# Patient Record
Sex: Female | Born: 1985 | Race: White | Hispanic: No | Marital: Single | Smoking: Never smoker
Health system: Southern US, Community
[De-identification: ages and names within clinical notes are randomized; demographics above are authoritative.]

## PROBLEM LIST (undated history)

## (undated) DIAGNOSIS — R251 Tremor, unspecified: Secondary | ICD-10-CM

## (undated) DIAGNOSIS — G250 Essential tremor: Principal | ICD-10-CM

## (undated) DIAGNOSIS — G252 Other specified forms of tremor: Principal | ICD-10-CM

## (undated) DIAGNOSIS — K219 Gastro-esophageal reflux disease without esophagitis: Secondary | ICD-10-CM

## (undated) DIAGNOSIS — Z20828 Contact with and (suspected) exposure to other viral communicable diseases: Secondary | ICD-10-CM

## (undated) DIAGNOSIS — N6452 Nipple discharge: Secondary | ICD-10-CM

## (undated) HISTORY — DX: Essential tremor: G25.0

## (undated) HISTORY — DX: Gastro-esophageal reflux disease without esophagitis: K21.9

## (undated) HISTORY — DX: Contact with and (suspected) exposure to other viral communicable diseases: Z20.828

## (undated) HISTORY — DX: Other specified forms of tremor: G25.2

## (undated) HISTORY — DX: Tremor, unspecified: R25.1

---
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
## 2008-11-28 ENCOUNTER — Emergency Department: Payer: Self-pay | Admitting: Emergency Medicine

## 2013-04-07 ENCOUNTER — Encounter: Payer: Self-pay | Admitting: Neurology

## 2013-04-08 ENCOUNTER — Ambulatory Visit: Payer: Self-pay | Admitting: Gastroenterology

## 2013-04-08 ENCOUNTER — Encounter (INDEPENDENT_AMBULATORY_CARE_PROVIDER_SITE_OTHER): Payer: Self-pay

## 2013-04-08 ENCOUNTER — Ambulatory Visit (INDEPENDENT_AMBULATORY_CARE_PROVIDER_SITE_OTHER): Payer: BC Managed Care – PPO | Admitting: Neurology

## 2013-04-08 ENCOUNTER — Encounter: Payer: Self-pay | Admitting: Neurology

## 2013-04-08 VITALS — BP 111/71 | HR 68 | Ht 63.0 in | Wt 122.0 lb

## 2013-04-08 DIAGNOSIS — R209 Unspecified disturbances of skin sensation: Secondary | ICD-10-CM

## 2013-04-08 DIAGNOSIS — G250 Essential tremor: Secondary | ICD-10-CM

## 2013-04-08 HISTORY — DX: Essential tremor: G25.0

## 2013-04-08 NOTE — Progress Notes (Signed)
Reason for visit: Tremor  Veronica Pena is a 27 y.o. female  History of present illness:  Veronica Pena is a 27 year old right-handed white female with a lifelong history of a tremor affecting both upper extremities, slightly worse on the left. The patient indicates that the tremor is more noticeable at times than others, and she believes that over the last several years the tremor has minimally worsened. The patient denies any other family history of tremor with the exception that her maternal grandfather had Parkinson's disease. The patient denies that the tremor is affecting her ability to perform tasks such as handwriting or feeding herself. The patient has recently undergone blood work that includes a thyroid profile that was unremarkable. The patient over the last month has noted transient episodes of numbness affecting the ulnar aspect of the left hand, and the lateral aspect of the right foot. The foot and hand may be affected independently of one another. The episodes last anywhere from 15 minutes to 1 hour, and may occur randomly once or twice a week. The between episodes, the patient feels completely normal. The patient denies any other associated symptoms such as weakness, clumsiness, discomfort, neck pain, back pain, or problems controlling the bowels or the bladder. The patient denies any problems with balance. The patient has been recently worked up for some abdominal discomfort, and she has been placed on Prilosec and a probiotic with some benefit. The patient does have headaches, but she does not correlate the numbness episodes with the headache. The patient gets 1 or 2 headaches a month. The patient is sent to this office for an evaluation.  Past Medical History  Diagnosis Date  . 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 Tremor     bilateral hands  . 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 No pertinent past surgical history.  History reviewed. No pertinent family  history.  Social history:  reports that she has never smoked. She has never used smokeless tobacco. She reports that she drinks alcohol. She reports that she does not use illicit drugs.  Medications:  Current Outpatient Prescriptions on File Prior to Visit  Medication Sig Dispense Refill  . omeprazole (PRILOSEC OTC) 20 MG tablet Take 20 mg by mouth daily.       No current facility-administered medications on file prior to visit.      Allergies  Allergen Reactions  . Vibramycin [Doxycycline Calcium] Hives and Shortness Of Breath    ROS:  Out of a complete 14 system review of symptoms, the patient complains only of the following symptoms, and all other reviewed systems are negative.  Palpitations of the heart Constipation Feeling hot, increased thirst, flushing Numbness, tremor  Blood pressure 111/71, pulse 68, height 5\' 3"  (1.6 m), weight 122 lb (55.339 kg).  Physical Exam  General: The patient is alert and cooperative at the time of the examination.  Head: Pupils are equal, round, and reactive to light. Discs are flat bilaterally.  Neck: The neck is supple, no carotid bruits are noted.  Respiratory: The respiratory examination is clear.  Cardiovascular: The cardiovascular examination reveals a regular rate and rhythm, no obvious murmurs or rubs are noted.  Skin: Extremities are without significant edema.  Neurologic Exam  Mental status:  Cranial nerves: Facial symmetry is present. There is good sensation of the face to pinprick and soft touch bilaterally. The strength of the facial muscles and the muscles to head turning and shoulder shrug are normal bilaterally. Speech  is well enunciated, no aphasia or dysarthria is noted. Extraocular movements are full. Visual fields are full.  Motor: The motor testing reveals 5 over 5 strength of all 4 extremities. Good symmetric motor tone is noted throughout.  Sensory: Sensory testing is intact to pinprick, soft touch,  vibration sensation, and position sense on all 4 extremities. No evidence of extinction is noted.  Coordination: Cerebellar testing reveals good finger-nose-finger and heel-to-shin bilaterally. A mild intention tremor seen with the hands bilaterally. The tremor mainly affects the index fingers and thumb laterally. Tinel sign on the wrists were negative bilaterally.  Gait and station: Gait is normal. Tandem gait is normal. Romberg is negative. No drift is seen.  Reflexes: Deep tendon reflexes are symmetric and normal bilaterally. Toes are downgoing bilaterally.   Assessment/Plan:  One. History of tremor, heightened physiologic tremor  2. Episodic, transient numbness  The patient has episodes of numbness lasting only a few minutes, with full resolution. The etiology of this is likely to be benign, and I do not believe that further neurologic workup at this time is indicated given a normal neurologic examination. If the symptoms become more persistent, or new symptoms are noted, the patient will contact our office at that time, and a reevaluation will be done. The tremor itself is likely a heightened physiologic tremor. Thyroid studies have been unremarkable. B12 levels have been unremarkable. If the patient finds that the tremor is affecting her ability to perform tasks with her hands, use of beta blocker such as propranolol may be helpful. At this point, the patient will followup through this office if needed.  Marlan Palau MD 04/08/2013 2:23 PM  [facility] [address] [place], [place] [address]  Phone [phone] Fax [phone]

## 2015-06-01 DIAGNOSIS — N6452 Nipple discharge: Secondary | ICD-10-CM

## 2015-06-01 HISTORY — DX: Nipple discharge: N64.52

## 2015-07-21 ENCOUNTER — Other Ambulatory Visit: Payer: Self-pay | Admitting: Obstetrics and Gynecology

## 2015-07-21 DIAGNOSIS — N6452 Nipple discharge: Secondary | ICD-10-CM

## 2015-08-01 ENCOUNTER — Ambulatory Visit
Admission: RE | Admit: 2015-08-01 | Discharge: 2015-08-01 | Disposition: A | Discharge status: Home / Self Care | Payer: BLUE CROSS/BLUE SHIELD | Source: Ambulatory Visit | Attending: Obstetrics and Gynecology | Admitting: Obstetrics and Gynecology

## 2015-08-01 DIAGNOSIS — N6452 Nipple discharge: Secondary | ICD-10-CM | POA: Diagnosis not present

## 2015-08-01 HISTORY — DX: Nipple discharge: N64.52

## 2016-08-19 ENCOUNTER — Other Ambulatory Visit: Payer: Self-pay

## 2016-08-19 ENCOUNTER — Ambulatory Visit (INDEPENDENT_AMBULATORY_CARE_PROVIDER_SITE_OTHER): Payer: BC Managed Care – PPO | Admitting: Family Medicine

## 2016-08-19 VITALS — BP 96/68 | HR 83 | Temp 98.5°F | Resp 16 | Wt 116.0 lb

## 2016-08-19 DIAGNOSIS — J0110 Acute frontal sinusitis, unspecified: Secondary | ICD-10-CM | POA: Diagnosis not present

## 2016-08-19 MED ORDER — PREDNISONE 5 MG PO TABS: 5.0000 mg | ORAL_TABLET | Freq: Every day | ORAL | 0 refills | Status: DC

## 2016-08-19 MED ORDER — AMOXICILLIN-POT CLAVULANATE 875-125 MG PO TABS: 1.0000 | ORAL_TABLET | Freq: Two times a day (BID) | ORAL | 0 refills | Status: DC

## 2016-08-19 NOTE — Progress Notes (Signed)
Patient: Veronica EastMary E Pena Female    DOB: 1986-01-20   30 y.o.   MRN: [id number] Visit Date: 08/19/2016  Today's Provider: Dortha Kernennis Mimi Debellis, PA   Chief Complaint  Patient presents with  . Establish Care  . Sinusitis   Subjective:    Veronica InglesMary Pena is a 31 year old female who presents today to Establish Care as a new patient. She did not transfer from another PCP.    Sinusitis  This is a new problem. Episode onset: Monday. The problem is unchanged. There has been no fever. Associated symptoms include ear pain and sinus pressure. (Dizziness ) Past treatments include antibiotics and oral decongestants. The treatment provided mild relief.  Patient [facility] on Thursday. She was prescribed Amoxicillin. She reports good compliance with treatment plan, but symptoms are unchanged.   Patient Active Problem List   Diagnosis Date Noted  . 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 Disturbance of skin sensation 04/08/2013   Past Medical History:  Diagnosis Date  . Acid reflux   . Breast discharge Dec 2016   1 time, milky discharge, spontaneous  . 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 Tremor    bilateral hands   No past surgical history on file.  No family history on file.  Allergies  Allergen Reactions  . Vibramycin [Doxycycline Calcium] Hives and Shortness Of Breath     Previous Medications   NORGESTIMATE-ETHINYL ESTRADIOL TRIPHASIC 0.18/0.215/0.25 MG-35 MCG TABLET    Take by mouth.   OMEPRAZOLE (PRILOSEC OTC) 20 MG TABLET    Take 20 mg by mouth daily.    Review of Systems  Constitutional: Negative for fever.  HENT: Positive for ear pain, sinus pain and sinus pressure.   Respiratory: Negative.   Cardiovascular: Negative.   Neurological: Positive for dizziness.    Social History  Substance Use Topics  . Smoking status: Never Smoker  . Smokeless tobacco: Never Used  . Alcohol use Yes   Comment: 2 beverages a week   Objective:   BP 96/68 (BP Location: Right Arm, Patient Position: Sitting, Cuff Size: Normal)   Pulse 83   Temp 98.5 F (36.9 C) (Oral)   Resp 16   Wt 116 lb (52.6 kg)   SpO2 98%   BMI 20.55 kg/m  LMP 2 weeks ago and not sexually active.  Physical Exam  Constitutional: She is oriented to person, place, and time. She appears well-developed and well-nourished. No distress.  HENT:  Head: Normocephalic and atraumatic.  Right Ear: Hearing and external ear normal.  Left Ear: Hearing and external ear normal.  Nose: Nose normal.  Mouth/Throat: Oropharynx is clear and moist.  Good transillumination of frontal and maxillary sinuses. Tenderness and pressure in frontal region. Some redness of nasal membranes.  Eyes: Conjunctivae and lids are normal. Right eye exhibits no discharge. Left eye exhibits no discharge. No scleral icterus.  Neck: Neck supple.  Cardiovascular: Normal rate and regular rhythm.   Pulmonary/Chest: Effort normal and breath sounds normal. No respiratory distress.  Abdominal: Soft. Bowel sounds are normal.  Musculoskeletal: Normal range of motion.  Lymphadenopathy:    She has no cervical adenopathy.  Neurological: She is alert and oriented to person, place, and time.  Skin: Skin is intact. No lesion and no rash noted.  Psychiatric: She has a normal mood and affect. Her speech is normal and behavior is normal. Thought content normal.  Assessment & Plan:     1. Acute frontal sinusitis, recurrence not specified Onset over the past week. Started Amoxicillin 4 days ago and no improvement in frontal pressure pain. No fever or sore throat. Has a history of some allergies that required desensitizing shots that were stopped in 2011. Had some sneezing initially with this episode. Will switch to the Augmentin and add a prednisone taper. Recheck in 10 days to see if improving or needing CT scan of sinuses. May use antihistamine and [place]  irrigation. - predniSONE (DELTASONE) 5 MG tablet; Take 1 tablet (5 mg total) by mouth daily with breakfast. Taper down by one tablets daily (6,5,4,3,2,1)  Dispense: 21 tablet; Refill: 0 - amoxicillin-clavulanate (AUGMENTIN) 875-125 MG tablet; Take 1 tablet by mouth 2 (two) times daily.  Dispense: 20 tablet; Refill: 0

## 2016-08-19 NOTE — Patient Instructions (Signed)
Sinus Rinse WHAT IS A SINUS RINSE? A sinus rinse is a home treatment. It rinses your sinuses with a mixture of salt and water (saline solution). Sinuses are air-filled spaces in your skull behind the bones of your face and forehead. They open into your nasal cavity. To do a sinus rinse, you will need:  Saline solution.  Neti pot or spray bottle. This releases the saline solution into your nose and through your sinuses. You can buy neti pots and spray bottles at:  Your local pharmacy.  A health food store.  Online. WHEN WOULD I DO A SINUS RINSE? A sinus rinse can help to clear your nasal cavity. It can clear:  Mucus.  Dirt.  Dust.  Pollen. You may do a sinus rinse when you have:  A cold.  A virus.  Allergies.  A sinus infection.  A stuffy nose. If you are considering a sinus rinse:  Ask your child's doctor before doing a sinus rinse on your child.  Do not do a sinus rinse if you have had:  Ear or nasal surgery.  An ear infection.  Blocked ears. HOW DO I DO A SINUS RINSE?  Wash your hands.  Disinfect your device using the directions that came with the device.  Dry your device.  Use the solution that comes with your device or one that is sold separately in stores. Follow the mixing directions on the package.  Fill your device with the amount of saline solution as stated in the device instructions.  Stand over a sink and tilt your head sideways over the sink.  Place the spout of the device in your upper nostril (the one closer to the ceiling).  Gently pour or squeeze the saline solution into the nasal cavity. The liquid should drain to the lower nostril if you are not too congested.  Gently blow your nose. Blowing too hard may cause ear pain.  Repeat in the other nostril.  Clean and rinse your device with clean water.  Air-dry your device. ARE THERE RISKS OF A SINUS RINSE? Sinus rinse is normally very safe and helpful. However, there are a few  risks, which include:  A burning feeling in the sinuses. This may happen if you do not make the saline solution as instructed. Make sure to follow all directions when making the saline solution.  Infection from unclean water. This is rare, but possible.  Nasal irritation. This information is not intended to replace advice given to you by your health care provider. Make sure you discuss any questions you have with your health care provider. Document Released: 01/12/2014 Document Revised: 02/11/2016 Document Reviewed: 11/02/2013 Elsevier Interactive Patient Education  2017 [employer]

## 2019-05-26 ENCOUNTER — Other Ambulatory Visit: Payer: Self-pay | Admitting: Student

## 2019-05-26 DIAGNOSIS — R1011 Right upper quadrant pain: Secondary | ICD-10-CM

## 2019-05-31 ENCOUNTER — Other Ambulatory Visit: Payer: Self-pay

## 2019-05-31 ENCOUNTER — Ambulatory Visit
Admission: RE | Admit: 2019-05-31 | Discharge: 2019-05-31 | Disposition: A | Discharge status: Home / Self Care | Payer: BC Managed Care – PPO | Source: Ambulatory Visit | Attending: Student | Admitting: Student

## 2019-05-31 DIAGNOSIS — R1011 Right upper quadrant pain: Secondary | ICD-10-CM | POA: Diagnosis not present

## 2019-06-08 ENCOUNTER — Encounter: Payer: Self-pay | Admitting: Adult Health

## 2019-06-08 ENCOUNTER — Ambulatory Visit
Admission: RE | Admit: 2019-06-08 | Discharge: 2019-06-08 | Disposition: A | Discharge status: Home / Self Care | Payer: BC Managed Care – PPO | Source: Ambulatory Visit | Attending: Adult Health | Admitting: Adult Health

## 2019-06-08 ENCOUNTER — Ambulatory Visit: Payer: BC Managed Care – PPO | Admitting: Adult Health

## 2019-06-08 ENCOUNTER — Other Ambulatory Visit: Payer: Self-pay | Admitting: Adult Health

## 2019-06-08 ENCOUNTER — Other Ambulatory Visit: Payer: Self-pay

## 2019-06-08 VITALS — BP 112/62 | HR 81 | Temp 96.6°F | Resp 16 | Wt 122.8 lb

## 2019-06-08 DIAGNOSIS — R1011 Right upper quadrant pain: Secondary | ICD-10-CM | POA: Insufficient documentation

## 2019-06-08 DIAGNOSIS — R0781 Pleurodynia: Secondary | ICD-10-CM

## 2019-06-08 DIAGNOSIS — K219 Gastro-esophageal reflux disease without esophagitis: Secondary | ICD-10-CM | POA: Diagnosis not present

## 2019-06-08 DIAGNOSIS — R10816 Epigastric abdominal tenderness: Secondary | ICD-10-CM | POA: Diagnosis not present

## 2019-06-08 DIAGNOSIS — K5909 Other constipation: Secondary | ICD-10-CM

## 2019-06-08 DIAGNOSIS — T839XXA Unspecified complication of genitourinary prosthetic device, implant and graft, initial encounter: Secondary | ICD-10-CM | POA: Insufficient documentation

## 2019-06-08 DIAGNOSIS — Z30430 Encounter for insertion of intrauterine contraceptive device: Secondary | ICD-10-CM | POA: Insufficient documentation

## 2019-06-08 LAB — POCT URINALYSIS DIPSTICK
Bilirubin, UA: NEGATIVE
Blood, UA: NEGATIVE
Glucose, UA: NEGATIVE
Ketones, UA: NEGATIVE
Leukocytes, UA: NEGATIVE
Nitrite, UA: NEGATIVE
Protein, UA: NEGATIVE
Spec Grav, UA: <=1.005 — AB (ref 1.010–1.025)
Urobilinogen, UA: 0.2 E.U./dL
pH, UA: 8 (ref 5.0–8.0)

## 2019-06-08 NOTE — Progress Notes (Signed)
Mild dilution of urine/ specific gravity mild decraese.

## 2019-06-08 NOTE — Progress Notes (Deleted)
Patient: Veronica Pena, Female    DOB: [birth year], 33 y.o.   MRN: [id number] Visit Date: 06/08/2019  Today's Provider: Marcille Buffy, FNP   No chief complaint on file.  Subjective:    New Patient Veronica Pena is a 33 y.o. female who presents today for health maintenance and establish care. She feels {DESC; WELL/FAIRLY WELL/POORLY:18703}. She reports exercising ***. She reports she is sleeping {DESC; WELL/FAIRLY WELL/POORLY:18703}.  -----------------------------------------------------------------   Review of Systems  Gastrointestinal: Positive for abdominal pain (x2 months).  All other systems reviewed and are negative.   Social History She  reports that she has never smoked. She has never used smokeless tobacco. She reports current alcohol use. She reports that she does not use drugs. Social History   Socioeconomic History  . Marital status: Single    Spouse name: Not on file  . Number of children: 0  . Years of education: college  . Highest education level: Not on file  Occupational History  . Not on file  Social Needs  . Financial resource strain: Not on file  . Food insecurity    Worry: Not on file    Inability: Not on file  . Transportation needs    Medical: Not on file    Non-medical: Not on file  Tobacco Use  . Smoking status: Never Smoker  . Smokeless tobacco: Never Used  Substance and Sexual Activity  . Alcohol use: Yes    Comment: 2 beverages a week  . Drug use: No  . Sexual activity: Never  Lifestyle  . Physical activity    Days per week: Not on file    Minutes per session: Not on file  . Stress: Not on file  Relationships  . Social Herbalist on phone: Not on file    Gets together: Not on file    Attends religious service: Not on file    Active member of club or organization: Not on file    Attends meetings of clubs or organizations: Not on file    Relationship status: Not on file  Other Topics Concern  .  Not on file  Social History Narrative  . Not on file    Patient Active Problem List   Diagnosis Date Noted  . 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 Disturbance of skin sensation 04/08/2013    No past surgical history on file.  Family History  Family Status  Relation Name Status  . Mother  Alive       Irritable bowel syndrome  . Father  Alive  . Brother  Alive  . MGM  Alive  . MGF  Deceased  . PGM  Alive  . PGF  Deceased   Her family history is not on file.     Allergies  Allergen Reactions  . Vibramycin [Doxycycline Calcium] Hives and Shortness Of Breath    Previous Medications   AMOXICILLIN-CLAVULANATE (AUGMENTIN) 875-125 MG TABLET    Take 1 tablet by mouth 2 (two) times daily.   NORGESTIMATE-ETHINYL ESTRADIOL TRIPHASIC 0.18/0.215/0.25 MG-35 MCG TABLET    Take by mouth.   OMEPRAZOLE (PRILOSEC OTC) 20 MG TABLET    Take 20 mg by mouth daily.   PREDNISONE (DELTASONE) 5 MG TABLET    Take 1 tablet (5 mg total) by mouth daily with breakfast. Taper down by one tablets daily (6,5,4,3,2,1)    Patient Care Team: Doreen Beam, FNP as PCP - General (  Family Medicine)      Objective:   Vitals: There were no vitals taken for this visit.   Physical Exam   Depression Screen No flowsheet data found.    Assessment & Plan:     Routine Health Maintenance and Physical Exam  Exercise Activities and Dietary recommendations Goals   None      There is no immunization history on file for this patient.  Health Maintenance  Topic Date Due  . HIV Screening  06/19/2003  . TETANUS/TDAP  06/19/2007  . PAP SMEAR-Modifier  06/18/2009  . INFLUENZA VACCINE  01/30/2019     Discussed health benefits of physical activity, and encouraged her to engage in regular exercise appropriate for her age and condition.    --------------------------------------------------------------------

## 2019-06-08 NOTE — Progress Notes (Addendum)
Patient: Veronica Pena Female    DOB: 1985-09-09   32 y.o.   MRN: [id number] Visit Date: 06/08/2019  Today's Provider: Jairo Ben, FNP   Chief Complaint  Patient presents with   Abdominal Pain   Subjective:     HPI  Follow up ER visit  Patient was seen in ER for complaints of abdominal pain and right flank pain x2 weeks on 06/01/2019. She was treated for abdominal pain and pyelonephritis . Treatment for this included giving patient a dose of Rocephin in ED and switching to Ceftin, patient was prescribed Cipro to treat for UTI . She has no urinary systems currently. She reports she noticed no change in these symptoms. She continues to have right upper quadrant pain with tenderness pressing on the area.   She describes pain with pushing below her bra in her upper right abdomen that radiates into her back at times and into the epigastrium Pain is intermittent but becoming more constant over the past two weeks. Rates pain 4/10.  She previously had a short lasting episode maybe " 30 seconds" of shortness of breath due to the pain she felt in upper stomach  - reports that has resolved. None today.  She reports good compliance with treatment. She reports this condition is Unchanged.  She did have constipation on a recent flat plate and reports constipation as noted in review below. With one episode of bright red blood in stools. She has no change in stool color and no more episodes of bleeding. She reports Saturday after the bowel clean out regimen 06/05/2019 she got relief and had what she feels was a " "normal bowel movement".    She uses NSAIDS rarely. She drinks alcohol occasionally. Stress is a factor.  Drinks a gallon of water usually.  No history of pneumonia in the past. Denies cough or congestion. She does report pain with having a very deep breath.  She denies any injury or trauma.  IUD for birth control.  No recnet surgeries. No recent flights.   Denies  any high risk sexual behavior. Denies any drug use history.  Patient states that she had a follow up appointment with [facility] on 06/02/19 and states that she had a telephone follow up on 06/08/19 and was instructed to come to her PCP. See below. It was recommended to have a HIDA scan. She did a bowel clean out and it relieved her constipation. She has not proceeded with HIDA scan yet.  She does not notice pain worse or better  with certain types of foods or with eating.  He denies  any cough. She does report indigestion and hiccups around 4 to 5 times ago. Mild fatigue. Denies any leg swelling or pain. Mild fatigue.  Denies pain with movement.  Denies any exertional or rest dyspnea or chest pain.   Urinating normally, she has no difficulty with constipation since  She had night sweats two weeks ago that resolved after one night. Denies any since. Denies any fevers.  Patient  denies any fever, body aches,chills, rash, chest pain, shortness of breath, nausea, vomiting, or diarrhea.   She denies any lower abdominal pain. S She takes Protonix 20 mg daily only as needed she reports.  Has used some NSAIDs recently.  Some stress.   Denies any umbilicus pain. Denies nausea currently she did have initially. No longer using promethazine. Denies vomiting, rectal blood or urine color change.  Denies any surgeries.   History  of documented anxiety. Norma ECG documented- reviewed.  Has been treated with Cipro and switched to Ceftin for UTI- flora found on culture. She denies any urinary symptoms.   She has also been treated for back muscle strain  without relief.  She denies any  saddle paresthesias, or loss of bowel and bladder control  Denies any palpitations.   Reviewed office note : GI telemedicine as below.  HISTORY OF PRESENT ILLNESS: Ms. Veronica Pena reports change in bowel habits over the last 2 months. She shares normally, she has bowel movements about every other day but over the last two months can go  up to one week without a BM and now stools are hard and hard rock pebbles. About 3 weeks ago had an isolated incident of BRBPR that she noticed coating the stool. This was an isolated incident and has not recurred. More recently she has developed back pain and flank pain which is her main concern. Ultimately was diagnosed with UTI (now of ceftin). Has also noticed a "swelling" to her RUQ and epigastrium. Plan:  1. TSH/FT4      FT4 was 1.23 H. TSH 1.11 2. Miralax titrated to effect  Diagnoses and all orders for this visit:  Constipation, unspecified constipation type  H pylori was negative.  Reviewed below : Care Everywhere Result Report Urine Culture, [facility]: 05/28/2019 5:35 AM [facility] Component Name Value Ref Range  Urine Culture, Routine - [facility] Final report   Result 1 - [facility] Comment  Comment: Mixed urogenital flora 10,000-25,000 colony forming units per mL   Specimen Collected on  Urine - Urinary bladder structure (body structure) 05/26/2019 11:54 AM  Result Narrative  Performed at: [address]  [address], [place], [place] [id number] Lab Director: Veronica Schimke MD, Phone: [phone]  06/01/2019 pregnancy test urine negative.06/01/2019 lipase 27.ALT 13 low. CMP ok. Hematocrit 47.0,  06/01/19 urinalysis 1 plus leukocytes, blood positive.Mixed flora on culture. D-dimer 215.  05/26/2019 visit reviewed [facility] care everywhere Patient Instructions Veronica Pena, [place] - 05/26/2019 9:15 AM EST  Electrolytes re-assuring. Liver enzymes re-assuring. WBC normal. Neutrophils slightly elevated. Hemoglobin normal. Lipase normal. Will call with h.pylori. Urine does show some trace leukocytes and blood. Have ordered US to further evaluate gallbladder - please see appointment information. In meantime, please take antibiotics as directed. Promethazine for nausea. Recommend miralax as well as colace per package instructions for  constipation. Rest. Hydrate. Keep diet bland. Keep scheduled appointment with GI. To ED if fever, sudden/severe abdominal pain, unable to keep food/fluids down.    Telephone Encounter - Veronica Pena Veronica Pena, [place] - 06/01/2019 4:10 PM EST Spoke with patient directly. Discussed with patient [place] normal. Spoke with Dr. Tonna Boehringer of general surgery, states next progression would be hida scan, agrees to see - referral placed. However, patient has continued pain 8/10 despite abx, anti-nausea medication. She also endorses increased fatigue. Also states she is feeling short of breath. Recommend further evaluation in ED. Patient voiced understanding. ------------------------------------------------------------------------------------ Review from care everywhere visit:  GI viist on 06/01/2019.Marland Kitchenold female presents with right flank and abdominal pain that began about 2 weeks ago. She reports that she noticed this discomfort when she was driving in her car coming home. It was an ache located in the right flank which radiated somewhat to the abdomen. The symptoms worsened over the next 48 hours. She went to local urgent care and had a urinalysis which was somewhat suggestive of UTI. She was placed on Cipro. And has been  taking this medication for the last 1 week. She reports that she started feeling a little bit better after about 4 days of Cipro but then the symptoms recurred. She describes an ache located in the flank in the upper abdomen. Not really hurt to move. Does not hurt to breathe. Eating has no effect on the pain. She represented to the clinic and an ultrasound was done on Friday which was negative. She presents tonight with the symptoms. She describes the symptoms as an ache right flank radiating somewhat into the abdomen hurts slightly to move but not markedly so. Pain is not made worse by movement or eating. She does not have any urinary symptoms. She is not nauseous nor she vomiting. Spoke with patient directly.  Discussed with patient [place] normal. Spoke with Dr. Tonna Boehringer of general surgery, states next progression would be hida scan, agrees to see - referral placed. However, patient has continued pain 8/10 despite abx, anti-nausea medication. She also endorses increased fatigue. Also states she is feeling short of breath. Recommend further evaluation in ED. Patient voiced understanding. ASSESSMENT AND PLAN: Ms. Keehan is a 33 y.o. female presenting for a new patient visit for constipation. Pt with 2 months of worsening constipation with hard stools and infrequent BMs. She has had significant back pain and flank pain and unsure if this is related but recently diagnosed with UTI (now completing Ceftin course). Also with RUQ and epigastric discomfort. Has had work up by ED with US gallbladder and CT abdomen pelvis non acute along with reassuring labs including CBC, CMP, lipase 06/01/2019 normal, H pylori antibody normal 05/26/2019. We discussed possibly her back pain and abdominal discomfort may be related to stool burden. We will plan for miralax colon clean out followed by miralax daily titrated to effect. We will check thyroid studies today to r/o thyroid dysfunction as an etiology for her constipation. She did have isolated incident of BRBPR a few weeks ago and this has not recurred, suspect possible hemorrhoidal vs fissure etiology. If BRBPR returns consider endoscopic evaluation for further investigation. She will f/ in 4 weeks for re-evaluation of her constipation. Advised pt if constipation resolves and back pain persists to f/u with PCP.     Allergies  Allergen Reactions   Vibramycin [Doxycycline Calcium] Hives and Shortness Of Breath     Current Outpatient Medications:    omeprazole (PRILOSEC OTC) 20 MG tablet, Take 20 mg by mouth daily., Disp: , Rfl:   Review of Systems  Constitutional: Positive for chills (2 weeks ago reported ) and fatigue. Negative for activity change, appetite change, diaphoresis,  fever and unexpected weight change.  HENT: Negative.   Respiratory: Positive for shortness of breath (history of in the past week at times with deep breath and hurts mostly in rib area where she is tender to touch ). Negative for apnea, cough, choking, chest tightness, wheezing and stridor.   Cardiovascular: Negative for chest pain, palpitations and leg swelling.  Gastrointestinal: Positive for abdominal pain. Negative for abdominal distention, anal bleeding, blood in stool, constipation, diarrhea, nausea, rectal pain and vomiting.  Endocrine: Negative for polydipsia, polyphagia and polyuria.  Genitourinary: Negative for decreased urine volume, difficulty urinating, dyspareunia, dysuria, enuresis, flank pain, frequency, genital sores, hematuria, menstrual problem, pelvic pain, urgency, vaginal bleeding, vaginal discharge and vaginal pain.  Musculoskeletal: Positive for back pain (right upper ). Negative for arthralgias, gait problem, joint swelling, myalgias, neck pain and neck stiffness.  Skin: Negative.  Negative for rash.  Allergic/Immunologic: Negative for immunocompromised  state.  Neurological: Negative for dizziness, tremors, seizures, syncope, facial asymmetry, speech difficulty, weakness, light-headedness, numbness and headaches.  Psychiatric/Behavioral: Negative.     Social History   Tobacco Use   Smoking status: Never Smoker   Smokeless tobacco: Never Used  Substance Use Topics   Alcohol use: Yes    Comment: 2 beverages a week      Objective:   BP 112/62    Pulse 81    Temp (!) 96.6 F (35.9 C) (Oral)    Resp 16    Wt 122 lb 12.8 oz (55.7 kg)    BMI 21.75 kg/m  Vitals:   06/08/19 1510  BP: 112/62  Pulse: 81  Resp: 16  Temp: (!) 96.6 F (35.9 C)  TempSrc: Oral  Weight: 122 lb 12.8 oz (55.7 kg)  Body mass index is 21.75 kg/m.   Physical Exam Vitals and nursing note reviewed.  Constitutional:      General: She is not in acute distress.    Appearance: She is  well-developed and normal weight. She is not ill-appearing, toxic-appearing or diaphoretic.  HENT:     Head: Normocephalic and atraumatic.     Mouth/Throat:     Mouth: Mucous membranes are moist.     Pharynx: No pharyngeal swelling or oropharyngeal exudate.  Eyes:     General: No scleral icterus.    Extraocular Movements: Extraocular movements intact.  Cardiovascular:     Rate and Rhythm: Normal rate and regular rhythm.     Heart sounds: Normal heart sounds. No murmur. No friction rub. No gallop.   Pulmonary:     Effort: Pulmonary effort is normal. No accessory muscle usage or respiratory distress.     Breath sounds: Normal air entry. No stridor. No wheezing, rhonchi or rales.    Chest:     Chest wall: No tenderness.  Abdominal:     General: Bowel sounds are normal. There is no distension or abdominal bruit. There are no signs of injury.     Palpations: Abdomen is soft. There is no shifting dullness, fluid wave, hepatomegaly, splenomegaly, mass or pulsatile mass.     Tenderness: There is abdominal tenderness (in area of rib marked on diagram, RUQ - epigastric area also tender with palpation. ) in the right upper quadrant and epigastric area. There is no right CVA tenderness, left CVA tenderness, guarding or rebound. Negative signs include Murphy's sign and McBurney's sign.     Hernia: No hernia is present.       Comments: Negative Jump test  Positive point test   Musculoskeletal:     Cervical back: Normal.     Thoracic back: Normal. No swelling, edema, deformity, tenderness or bony tenderness. Normal range of motion.     Lumbar back: Normal.       Back:     Comments: Subscapular pain right side at times in  Area marked on diagram, patient reports intermittent pain radiation- none currently.   No rash   Lymphadenopathy:     Head:     Right side of head: No submental, submandibular, tonsillar, preauricular, posterior auricular or occipital adenopathy.     Left side of head: No  submental, submandibular, tonsillar, preauricular, posterior auricular or occipital adenopathy.     Cervical: No cervical adenopathy.     Right cervical: No superficial, deep or posterior cervical adenopathy.    Left cervical: No superficial, deep or posterior cervical adenopathy.  Skin:    General: Skin is warm and dry.  Capillary Refill: Capillary refill takes less than 2 seconds.     Coloration: Skin is not ashen, cyanotic or jaundiced.     Findings: No rash.     Nails: There is no clubbing.     Comments: Not jaundice.  Neurological:     General: No focal deficit present.     Mental Status: She is alert and oriented to person, place, and time.     GCS: GCS eye subscore is 4. GCS verbal subscore is 5. GCS motor subscore is 6.     Cranial Nerves: Cranial nerves are intact. No cranial nerve deficit.     Motor: Motor function is intact. No weakness.     Coordination: Coordination is intact.     Gait: Gait is intact.     Deep Tendon Reflexes: Reflexes are normal and symmetric.  Psychiatric:        Mood and Affect: Mood is anxious. Mood is not depressed.        Behavior: Behavior normal.      Results for orders placed or performed in visit on 06/08/19  POCT urinalysis dipstick  Result Value Ref Range   Color, UA yellow    Clarity, UA clear    Glucose, UA Negative Negative   Bilirubin, UA negative    Ketones, UA negative    Spec Grav, UA <=1.005 (A) 1.010 - 1.025   Blood, UA negative    pH, UA 8.0 5.0 - 8.0   Protein, UA Negative Negative   Urobilinogen, UA 0.2 0.2 or 1.0 E.U./dL   Nitrite, UA negative    Leukocytes, UA Negative Negative   Appearance     Odor         Assessment & Plan    1. Right upper quadrant abdominal pain She will follow back up with her gastrointestinal MD. Will check Chest x ray and rib view  Given her exam and otherwise normal and [facility]  She will proceed with Hida scan [place] by [place] in [place] and follow back  up with them for that order.   2. Pain in rib- right   Orders Placed This Encounter  Procedures   DG Chest 2 View   DG Abd 1 View   DG Ribs Unilateral Right   CMP 322000   CBC with Differential/Platelet 005009   Lipase   Amylase   POCT urinalysis dipstick   Will check labs again, X rays as above to rule out pneumonia, stone, continued constipation, rib injury.   Will give Nexium until sees GI. Requests refferal to [facility]. Discussed side effects. She has not been taking her Prilosec. Discontinued it.  Meds ordered this encounter  Medications   esomeprazole (NEXIUM) 40 MG capsule    Sig: Take 1 capsule (40 mg total) by mouth daily. Take one hour before eating and take  only one time daily.    Dispense:  90 capsule    Refill:  0   If fever, chills, increasing pain or fatigue, or any other new or worsening symptoms seek care immediately.  Discussed emergent symptoms and when to seek emergency medical care.   Needs primary care and  she was not interested today- she will call back- as she lives in [place] and unsure where she wants to go.   Advised patient call the office or your primary care doctor for an appointment if no improvement within 72 hours or if any symptoms change or worsen at any time  Advised  ER or urgent Care if after hours or on weekend. Call 911 for emergency symptoms at any time.Patinet verbalized understanding of all instructions given/reviewed and treatment plan and has no further questions or concerns at this time.        The entirety of the information documented in the History of Present Illness, Review of Systems and Physical Exam were personally obtained by me. Portions of this information were initially documented by the  Certified Medical Assistant whose name is documented in Epic and reviewed by me for thoroughness and accuracy.  I have personally performed the exam and reviewed the chart and it is accurate to the best of my knowledge.  Dentist has been used and any errors in dictation or transcription are unintentional.  Eula Fried. Flinchum FNP-C  [facility]   Veronica Ben, FNP  [facility] [place] as a scribe for [employer] Flinchum, FNP.,have documented all relevant documentation on the behalf of Veronica Ben, FNP,as directed by  Veronica Ben, FNP while in the presence of Veronica Ben, FNP.

## 2019-06-08 NOTE — Patient Instructions (Signed)
Chest Wall Pain Chest wall pain is pain in or around the bones and muscles of your chest. Chest wall pain may be caused by:  An injury.  Coughing a lot.  Using your chest and arm muscles too much. Sometimes, the cause may not be known. This pain may take a few weeks or longer to get better. Follow these instructions at home: Managing pain, stiffness, and swelling If told, put ice on the painful area:  Put ice in a plastic bag.  Place a towel between your skin and the bag.  Leave the ice on for 20 minutes, 2-3 times a day.  Activity  Rest as told by your doctor.  Avoid doing things that cause pain. This includes lifting heavy items.  Ask your doctor what activities are safe for you. General instructions   Take over-the-counter and prescription medicines only as told by your doctor.  Do not use any products that contain nicotine or tobacco, such as cigarettes, e-cigarettes, and chewing tobacco. If you need help quitting, ask your doctor.  Keep all follow-up visits as told by your doctor. This is important. Contact a doctor if:  You have a fever.  Your chest pain gets worse.  You have new symptoms. Get help right away if:  You feel sick to your stomach (nauseous) or you throw up (vomit).  You feel sweaty or light-headed.  You have a cough with mucus from your lungs (sputum) or you cough up blood.  You are short of breath. These symptoms may be an emergency. Do not wait to see if the symptoms will go away. Get medical help right away. Call your local emergency services (911 in the [place] Do not drive yourself to the hospital. Summary  Chest wall pain is pain in or around the bones and muscles of your chest.  It may be treated with ice, rest, and medicines. Your condition may also get better if you avoid doing things that cause pain.  Contact a doctor if you have a fever, chest pain that gets worse, or new symptoms.  Get help right away if you feel light-headed  or you get short of breath. These symptoms may be an emergency. This information is not intended to replace advice given to you by your health care provider. Make sure you discuss any questions you have with your health care provider. Document Released: 12/04/2007 Document Revised: 12/18/2017 Document Reviewed: 12/18/2017 Elsevier Patient Education  2020 Elsevier Inc. Abdominal Pain, Adult  Many things can cause belly (abdominal) pain. Most times, belly pain is not dangerous. Many cases of belly pain can be watched and treated at home. Sometimes belly pain is serious, though. Your doctor will try to find the cause of your belly pain. Follow these instructions at home:  Take over-the-counter and prescription medicines only as told by your doctor. Do not take medicines that help you poop (laxatives) unless told to by your doctor.  Drink enough fluid to keep your pee (urine) clear or pale yellow.  Watch your belly pain for any changes.  Keep all follow-up visits as told by your doctor. This is important. Contact a doctor if:  Your belly pain changes or gets worse.  You are not hungry, or you lose weight without trying.  You are having trouble pooping (constipated) or have watery poop (diarrhea) for more than 2-3 days.  You have pain when you pee or poop.  Your belly pain wakes you up at night.  Your pain gets worse with meals,  after eating, or with certain foods.  You are throwing up and cannot keep anything down.  You have a fever. Get help right away if:  Your pain does not go away as soon as your doctor says it should.  You cannot stop throwing up.  Your pain is only in areas of your belly, such as the right side or the left lower part of the belly.  You have bloody or black poop, or poop that looks like tar.  You have very bad pain, cramping, or bloating in your belly.  You have signs of not having enough fluid or water in your body (dehydration), such as: ? Dark pee,  very little pee, or no pee. ? Cracked lips. ? Dry mouth. ? Sunken eyes. ? Sleepiness. ? Weakness. This information is not intended to replace advice given to you by your health care provider. Make sure you discuss any questions you have with your health care provider. Document Released: 12/04/2007 Document Revised: 01/05/2016 Document Reviewed: 11/29/2015 Elsevier Interactive Patient Education  [employer].

## 2019-06-09 DIAGNOSIS — R1011 Right upper quadrant pain: Secondary | ICD-10-CM | POA: Insufficient documentation

## 2019-06-09 DIAGNOSIS — R10816 Epigastric abdominal tenderness: Secondary | ICD-10-CM | POA: Insufficient documentation

## 2019-06-09 DIAGNOSIS — K219 Gastro-esophageal reflux disease without esophagitis: Secondary | ICD-10-CM | POA: Insufficient documentation

## 2019-06-09 DIAGNOSIS — R0781 Pleurodynia: Secondary | ICD-10-CM | POA: Insufficient documentation

## 2019-06-09 LAB — COMPREHENSIVE METABOLIC PANEL
ALT: 9 IU/L (ref 0–32)
AST: 18 IU/L (ref 0–40)
Albumin/Globulin Ratio: 2 (ref 1.2–2.2)
Albumin: 4.3 g/dL (ref 3.9–5.0)
Alkaline Phosphatase: 57 IU/L (ref 39–117)
BUN/Creatinine Ratio: 9 (ref 9–23)
BUN: 9 mg/dL (ref 6–20)
Bilirubin Total: 0.4 mg/dL (ref 0.0–1.2)
CO2: 24 mmol/L (ref 20–29)
Calcium: 9.7 mg/dL (ref 8.7–10.2)
Chloride: 103 mmol/L (ref 96–106)
Creatinine, Ser: 0.98 mg/dL (ref 0.57–1.00)
GFR calc Af Amer: 90 mL/min/{1.73_m2} (ref >59)
GFR calc non Af Amer: 78 mL/min/{1.73_m2} (ref >59)
Globulin, Total: 2.2 g/dL (ref 1.5–4.5)
Glucose: 82 mg/dL (ref 65–99)
Potassium: 4.2 mmol/L (ref 3.5–5.2)
Sodium: 141 mmol/L (ref 134–144)
Total Protein: 6.5 g/dL (ref 6.0–8.5)

## 2019-06-09 LAB — CBC WITH DIFFERENTIAL/PLATELET
Basophils Absolute: 0 10*3/uL (ref 0.0–0.2)
Basos: 0 %
EOS (ABSOLUTE): 0.1 10*3/uL (ref 0.0–0.4)
Eos: 1 %
Hematocrit: 37.5 % (ref 34.0–46.6)
Hemoglobin: 12.7 g/dL (ref 11.1–15.9)
Immature Grans (Abs): 0 10*3/uL (ref 0.0–0.1)
Immature Granulocytes: 0 %
Lymphocytes Absolute: 1.6 10*3/uL (ref 0.7–3.1)
Lymphs: 22 %
MCH: 31.4 pg (ref 26.6–33.0)
MCHC: 33.9 g/dL (ref 31.5–35.7)
MCV: 93 fL (ref 79–97)
Monocytes Absolute: 0.6 10*3/uL (ref 0.1–0.9)
Monocytes: 8 %
Neutrophils Absolute: 5.2 10*3/uL (ref 1.4–7.0)
Neutrophils: 69 %
Platelets: 239 10*3/uL (ref 150–450)
RBC: 4.05 x10E6/uL (ref 3.77–5.28)
RDW: 11.6 % — ABNORMAL LOW (ref 11.7–15.4)
WBC: 7.6 10*3/uL (ref 3.4–10.8)

## 2019-06-09 LAB — LIPASE: Lipase: 21 U/L (ref 14–72)

## 2019-06-09 LAB — AMYLASE: Amylase: 85 U/L (ref 31–110)

## 2019-06-09 MED ORDER — ESOMEPRAZOLE MAGNESIUM 40 MG PO CPDR: 40.0000 mg | DELAYED_RELEASE_CAPSULE | Freq: Every day | ORAL | 0 refills | Status: DC

## 2019-06-09 NOTE — Addendum Note (Signed)
Addended by: Doreen Beam on: 06/09/2019 09:17 AM   Modules accepted: Orders

## 2019-06-11 ENCOUNTER — Telehealth: Payer: Self-pay | Admitting: Adult Health

## 2019-06-11 NOTE — Telephone Encounter (Signed)
Patient walked into the office and came to check in counter leaning over saying she was in severe pain needing to see Laverna Peace, FNP again.  Pt said she couldn't see the GI until Wednesday but she couldn't wait that long because her pain was so bad.    She said she had wento the GI's office but doors were locked.    She said she didn't want to go to the ER because she had been before and they "didn't find the cause".    I spoke to Dexter and she advised me to tell the patient to go to the ER for further testing and a workup. Patient advised to go to ER. She was reluctant to go but said she would.

## 2019-06-14 LAB — EPSTEIN-BARR VIRUS (EBV) ANTIBODY PROFILE
EBV NA IgG: 63.2 U/mL — ABNORMAL HIGH (ref 0.0–17.9)
EBV VCA IgG: >600 U/mL — ABNORMAL HIGH (ref 0.0–17.9)
EBV VCA IgM: <36 U/mL (ref 0.0–35.9)

## 2019-06-14 LAB — SPECIMEN STATUS REPORT

## 2019-06-16 ENCOUNTER — Encounter: Payer: Self-pay | Admitting: Gastroenterology

## 2019-06-16 ENCOUNTER — Ambulatory Visit (INDEPENDENT_AMBULATORY_CARE_PROVIDER_SITE_OTHER): Payer: BC Managed Care – PPO | Admitting: Gastroenterology

## 2019-06-16 ENCOUNTER — Other Ambulatory Visit: Payer: Self-pay

## 2019-06-16 ENCOUNTER — Encounter: Payer: Self-pay | Admitting: Adult Health

## 2019-06-16 VITALS — BP 100/69 | HR 101 | Temp 97.2°F | Ht 63.0 in | Wt 117.8 lb

## 2019-06-16 DIAGNOSIS — R1011 Right upper quadrant pain: Secondary | ICD-10-CM

## 2019-06-16 NOTE — Progress Notes (Signed)
Gastroenterology Consultation  Referring Provider:     Sharmon Leyden* Primary Care Physician:  Doreen Beam, FNP Primary Gastroenterologist:  Dr. Allen Norris     Reason for Consultation:     Right side abdominal pain        HPI:   Veronica Pena is a 33 y.o. y/o female referred for consultation & management of right-sided abdominal pain by Dr. Claudina Lick, Kelby Aline, FNP.  This patient comes in today after being seen by her primary care physician back on November 25 for abdominal pain and back pain.  She had stated that the pain was a dull pain.  The patient also had reported to her primary care provider that the pain was worse with eating associate with some nausea.  The patient had also reported at that time she had 2 months of constipation.  Subsequent to that the patient was seen in the ED at [facility] and had reported some right-sided abdominal pain with abdominal swelling and constipation.  The following day she was then seen by a gastroenterologist at [facility] for the same symptoms.  The patient had a normal right upper quadrant ultrasound and also a normal CT scan of the abdomen.  During her visit with gastroenterology she was recommended to take MiraLAX to help with her stool burden and it was thought that that may be contributing to her back pain and abdominal pain.  The patient also had thyroid studies sent off for possible cause of her constipation. The patient reports that her symptoms are have kept her at work.  The patient reports that when she moves around and at work she was having worsening of her pain between her shoulders.  The patient also reports that her abdominal discomfort is made worse with greasy and fatty foods and she is gone on a bland diet with some help.  The patient is very frustrated about her pains.  She also states that her pain is not only on the right side but it radiates around to her back and along her lower ribs.  She also has noticed that the pain is started  to migrate to the right chest and then to the right armpit.  The patient does not appreciate any significant weight loss recently.  The patient was told by her previous doctors that a HIDA scan would be the next step in her gallbladder work-up.  Past Medical History:  Diagnosis Date  . Acid reflux   . Breast discharge Dec 2016   1 time, milky discharge, spontaneous  . 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 Tremor    bilateral hands    No past surgical history on file.  Prior to Admission medications   Medication Sig Start Date End Date Taking? Authorizing Provider  esomeprazole (NEXIUM) 40 MG capsule Take 1 capsule (40 mg total) by mouth daily. Take one hour before eating and take  only one time daily. 06/09/19   Flinchum, Kelby Aline, FNP    No family history on file.   Social History   Tobacco Use  . Smoking status: Never Smoker  . Smokeless tobacco: Never Used  Substance Use Topics  . Alcohol use: Yes    Comment: 2 beverages a week  . Drug use: No    Allergies as of 06/16/2019 - Review Complete 06/08/2019  Allergen Reaction Noted  . Vibramycin [doxycycline calcium] Hives and Shortness Of Breath 04/07/2013    Review of  Systems:    All systems reviewed and negative except where noted in HPI.   Physical Exam:  There were no vitals taken for this visit. No LMP recorded. (Menstrual status: IUD). General:   Alert,  Well-developed, well-nourished, pleasant and cooperative in NAD Head:  Normocephalic and atraumatic. Eyes:  Sclera clear, no icterus.   Conjunctiva pink. Ears:  Normal auditory acuity. Neck:  Supple; no masses or thyromegaly. Thorax: There is pinpoint tenderness in between the shoulder blades over the muscles on the right side. Lungs:  Respirations even and unlabored.  Clear throughout to auscultation.   No wheezes, crackles, or rhonchi. No acute distress. Heart:  Regular rate and rhythm; no murmurs, clicks, rubs, or  gallops. Abdomen:  Normal bowel sounds.  No bruits.  Soft, there is tenderness palpation over the lower ribs along the entire course of the ribs on the right and non-distended without masses, hepatosplenomegaly or hernias noted.  No guarding or rebound tenderness.  Negative Carnett sign.  There is no gallbladder tenderness. Rectal:  Deferred.  Msk:  Symmetrical without gross deformities.  Good, equal movement & strength bilaterally. Pulses:  Normal pulses noted. Extremities:  No clubbing or edema.  No cyanosis. Neurologic:  Alert and oriented x3;  grossly normal neurologically. Skin:  Intact without significant lesions or rashes.  No jaundice. Lymph Nodes:  No significant cervical adenopathy. Psych:  Alert and cooperative. Normal mood and affect.  Imaging Studies: DG Chest 2 View  Result Date: 06/08/2019 CLINICAL DATA:  Right upper back pain. Rhonchi in the right upper lobe. EXAM: CHEST - 2 VIEW COMPARISON:  None. FINDINGS: The heart size and mediastinal contours are within normal limits. Both lungs are clear. The visualized skeletal structures are unremarkable. IMPRESSION: No active cardiopulmonary disease. Electronically Signed   By: Gerome Samavid  Williams III M.D   On: 06/08/2019 17:20   DG Ribs Unilateral Right  Result Date: 06/08/2019 CLINICAL DATA:  Pain. EXAM: RIGHT RIBS - 2 VIEW COMPARISON:  None. FINDINGS: No fracture or other bone lesions are seen involving the ribs. IMPRESSION: Negative. Electronically Signed   By: Gerome Samavid  Williams III M.D   On: 06/08/2019 17:21   DG Abd 1 View  Result Date: 06/08/2019 CLINICAL DATA:  History of constipation.  Distention. EXAM: ABDOMEN - 1 VIEW COMPARISON:  None. FINDINGS: Moderate fecal loading throughout the colon. IUD in the pelvis. Phleboliths in the left pelvis. No other acute abnormalities. IMPRESSION: Moderate fecal loading in the colon.  No other abnormalities. Electronically Signed   By: Gerome Samavid  Williams III M.D   On: 06/08/2019 17:20   US Abdomen  Complete  Result Date: 05/31/2019 CLINICAL DATA:  Epigastric pain with abdominal bloating EXAM: ABDOMEN ULTRASOUND COMPLETE COMPARISON:  None. FINDINGS: Gallbladder: No gallstones or wall thickening visualized. There is no pericholecystic fluid. No sonographic Murphy sign noted by sonographer. Common bile duct: Diameter: 3 mm. No intrahepatic, common hepatic, or common bile duct dilatation. Liver: No focal lesion identified. Within normal limits in parenchymal echogenicity. Portal vein is patent on color Doppler imaging with normal direction of blood flow towards the liver. IVC: No abnormality visualized. Pancreas: No pancreatic mass or inflammatory focus. Spleen: Size and appearance within normal limits. Right Kidney: Length: 10.0 cm. Echogenicity within normal limits. No mass or hydronephrosis visualized. Left Kidney: Length: 10.8 cm. Echogenicity within normal limits. No mass or hydronephrosis visualized. Abdominal aorta: No aneurysm visualized. Other findings: No demonstrable ascites. IMPRESSION: Study within normal limits. Electronically Signed   By: Bretta BangWilliam  Woodruff III M.D.  On: 05/31/2019 08:52    Assessment and Plan:   Veronica Pena is a 33 y.o. y/o female who comes in today with multiple issues including pain that is reproducible to touch between the shoulder blades that she has been treating with a heating pad.  She also has constipation that has been improved with MiraLAX and does not seem to be an issue.  She was originally treated with a: Urge which did not completely work but then she repeated it and states that she is now moving her bowels regularly.  The patient's constipation is likely a result of her other medical issues causing her to have constipation related to the stress.  The patient's right-sided abdominal pain is clearly only over the ribs and tender to light palpation on the bone.  Due to her having intolerance to fatty and greasy foods the patient will be set up for a  gallbladder emptying study.  In light of her multiple musculoskeletal issues including the back and rib pain in addition to her reporting that recently the symptoms have started to migrate to her right chest and armpit I believe that a evaluation by rheumatologist would be warranted.  The patient has been explained the plan and agrees with it.    Midge Minium, MD. Clementeen Graham    Note: This dictation was prepared with Dragon dictation along with smaller phrase technology. Any transcriptional errors that result from this process are unintentional.

## 2019-06-18 ENCOUNTER — Other Ambulatory Visit: Payer: Self-pay

## 2019-06-18 DIAGNOSIS — R1011 Right upper quadrant pain: Secondary | ICD-10-CM

## 2019-06-23 ENCOUNTER — Other Ambulatory Visit: Payer: Self-pay

## 2019-06-23 ENCOUNTER — Ambulatory Visit
Admission: RE | Admit: 2019-06-23 | Discharge: 2019-06-23 | Disposition: A | Discharge status: Home / Self Care | Payer: BC Managed Care – PPO | Source: Ambulatory Visit | Attending: Gastroenterology | Admitting: Gastroenterology

## 2019-06-23 DIAGNOSIS — R1011 Right upper quadrant pain: Secondary | ICD-10-CM | POA: Diagnosis not present

## 2019-06-23 MED ORDER — TECHNETIUM TC 99M MEBROFENIN IV KIT
5.0000 | PACK | Freq: Once | INTRAVENOUS | Status: AC | PRN
  Administered 2019-06-23: 08:00:00 5.03 via INTRAVENOUS

## 2019-06-28 ENCOUNTER — Telehealth: Payer: Self-pay

## 2019-06-28 NOTE — Telephone Encounter (Signed)
-----   Message from Lucilla Lame, MD sent at 06/23/2019 10:58 AM EST ----- Let the patient know the gall bladder test showed the gall bladder to be working well.

## 2019-06-28 NOTE — Telephone Encounter (Signed)
Pt notified of HIDA scan results via MyChart.

## 2019-06-29 DIAGNOSIS — H579 Unspecified disorder of eye and adnexa: Secondary | ICD-10-CM | POA: Insufficient documentation

## 2019-06-29 DIAGNOSIS — M940 Chondrocostal junction syndrome [Tietze]: Secondary | ICD-10-CM | POA: Insufficient documentation

## 2019-06-29 DIAGNOSIS — M2550 Pain in unspecified joint: Secondary | ICD-10-CM | POA: Insufficient documentation

## 2019-06-29 DIAGNOSIS — M791 Myalgia, unspecified site: Secondary | ICD-10-CM | POA: Insufficient documentation

## 2019-07-13 DIAGNOSIS — M7918 Myalgia, other site: Secondary | ICD-10-CM | POA: Insufficient documentation

## 2019-09-22 ENCOUNTER — Other Ambulatory Visit: Payer: Self-pay | Admitting: Adult Health

## 2019-09-22 DIAGNOSIS — K219 Gastro-esophageal reflux disease without esophagitis: Secondary | ICD-10-CM

## 2019-09-22 MED ORDER — ESOMEPRAZOLE MAGNESIUM 20 MG PO CPDR: 20.0000 mg | DELAYED_RELEASE_CAPSULE | Freq: Every day | ORAL | 0 refills | Status: AC

## 2019-09-22 NOTE — Telephone Encounter (Signed)
Left message for patient advising. KW

## 2019-09-22 NOTE — Telephone Encounter (Signed)
She sent refill request for Nexium 40 mg, she went to GI, I have not seen her for follow up to see how her symptoms are going. I have decreased to Nexium 20 mg to see how she will do with this. She should return to office before the prescription I sent today runs out. 09/22/2019

## 2020-02-27 IMAGING — NM NM HEPATO W/GB/PHARM/[PERSON_NAME]
2 series · 12 of 12 positions shown · non-contrast
Comparison: None.

CLINICAL DATA: Upper abdominal pain

EXAM:
NUCLEAR MEDICINE HEPATOBILIARY IMAGING WITH GALLBLADDER EF
VIEWS:
Anterior right upper quadrant
RADIOPHARMACEUTICALS:  5.03 mCi Rc-HHm  Choletec IV

[Series 1000: hepatobiliary scan · 9.59mm/px · 6 of 60 frames shown]
[frame 6/60]
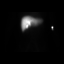
[frame 16/60]
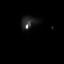
[frame 26/60]
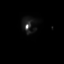
[frame 36/60]
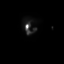
[frame 46/60]
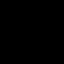
[frame 56/60]
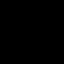

[Series 1000: gallbladder ef · 4.80mm/px · 6 of 120 frames shown]
[frame 11/120]
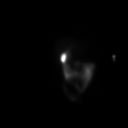
[frame 31/120]
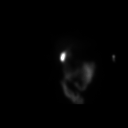
[frame 51/120]
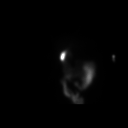
[frame 71/120]
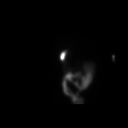
[frame 91/120]
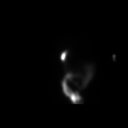
[frame 111/120]
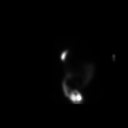

[12 of 12 positions shown; findings below may reference images not displayed]

FINDINGS: Liver uptake of radiotracer is unremarkable. There is prompt
visualization of gallbladder and small bowel, indicating patency of
the cystic and common bile ducts. The patient consumed 8 ounces of
Ensure orally with calculation of the computer generated ejection
fraction of radiotracer from the gallbladder. Patient did not
experience clinical symptoms with the oral Ensure consumption. The
computer generated ejection fraction of radiotracer from the
gallbladder is within normal limits at 42%, normal greater than 33%
using the oral agent.
IMPRESSION: Study within normal limits.

## 2021-10-10 ENCOUNTER — Emergency Department
Admission: EM | Admit: 2021-10-10 | Discharge: 2021-10-11 | Disposition: A | Discharge status: Home / Self Care | Payer: BC Managed Care – PPO | Attending: Emergency Medicine | Admitting: Emergency Medicine

## 2021-10-10 ENCOUNTER — Emergency Department: Payer: BC Managed Care – PPO

## 2021-10-10 DIAGNOSIS — Z20822 Contact with and (suspected) exposure to covid-19: Secondary | ICD-10-CM | POA: Insufficient documentation

## 2021-10-10 DIAGNOSIS — J029 Acute pharyngitis, unspecified: Secondary | ICD-10-CM | POA: Diagnosis present

## 2021-10-10 DIAGNOSIS — J039 Acute tonsillitis, unspecified: Secondary | ICD-10-CM | POA: Insufficient documentation

## 2021-10-10 DIAGNOSIS — Z21 Asymptomatic human immunodeficiency virus [HIV] infection status: Secondary | ICD-10-CM | POA: Insufficient documentation

## 2021-10-10 DIAGNOSIS — E876 Hypokalemia: Secondary | ICD-10-CM | POA: Insufficient documentation

## 2021-10-10 LAB — CBC WITH DIFFERENTIAL/PLATELET
Abs Immature Granulocytes: 0.06 10*3/uL (ref 0.00–0.07)
Basophils Absolute: 0 10*3/uL (ref 0.0–0.1)
Basophils Relative: 0 %
Eosinophils Absolute: 0 10*3/uL (ref 0.0–0.5)
Eosinophils Relative: 0 %
HCT: 37.8 % (ref 36.0–46.0)
Hemoglobin: 12.4 g/dL (ref 12.0–15.0)
Immature Granulocytes: 1 %
Lymphocytes Relative: 12 %
Lymphs Abs: 1.2 10*3/uL (ref 0.7–4.0)
MCH: 30 pg (ref 26.0–34.0)
MCHC: 32.8 g/dL (ref 30.0–36.0)
MCV: 91.5 fL (ref 80.0–100.0)
Monocytes Absolute: 0.8 10*3/uL (ref 0.1–1.0)
Monocytes Relative: 8 %
Neutro Abs: 7.5 10*3/uL (ref 1.7–7.7)
Neutrophils Relative %: 79 %
Platelets: 179 10*3/uL (ref 150–400)
RBC: 4.13 MIL/uL (ref 3.87–5.11)
RDW: 12.9 % (ref 11.5–15.5)
WBC: 9.5 10*3/uL (ref 4.0–10.5)
nRBC: 0 % (ref 0.0–0.2)

## 2021-10-10 LAB — BASIC METABOLIC PANEL
Anion gap: 10 (ref 5–15)
BUN: 6 mg/dL (ref 6–20)
CO2: 24 mmol/L (ref 22–32)
Calcium: 8.6 mg/dL — ABNORMAL LOW (ref 8.9–10.3)
Chloride: 104 mmol/L (ref 98–111)
Creatinine, Ser: 0.71 mg/dL (ref 0.44–1.00)
GFR, Estimated: >60 mL/min (ref >60)
Glucose, Bld: 101 mg/dL — ABNORMAL HIGH (ref 70–99)
Potassium: 3.2 mmol/L — ABNORMAL LOW (ref 3.5–5.1)
Sodium: 138 mmol/L (ref 135–145)

## 2021-10-10 LAB — RESP PANEL BY RT-PCR (FLU A&B, COVID) ARPGX2
Influenza A by PCR: NEGATIVE
Influenza B by PCR: NEGATIVE
SARS Coronavirus 2 by RT PCR: NEGATIVE

## 2021-10-10 LAB — GROUP A STREP BY PCR: Group A Strep by PCR: NOT DETECTED

## 2021-10-10 MED ORDER — ONDANSETRON HCL 4 MG/2ML IJ SOLN
4.0000 mg | Freq: Once | INTRAMUSCULAR | Status: AC
  Administered 2021-10-10: 4 mg via INTRAVENOUS
  Filled 2021-10-10: qty 2

## 2021-10-10 MED ORDER — DEXAMETHASONE 1 MG/ML PO CONC
10.0000 mg | Freq: Once | ORAL | Status: AC
  Administered 2021-10-10: 10 mg via ORAL
  Filled 2021-10-10: qty 10

## 2021-10-10 MED ORDER — SODIUM CHLORIDE 0.9 % IV BOLUS
1000.0000 mL | Freq: Once | INTRAVENOUS | Status: AC
Start: 2021-10-10 — End: 2021-10-10
  Administered 2021-10-10: 1000 mL via INTRAVENOUS

## 2021-10-10 MED ORDER — IOHEXOL 300 MG/ML  SOLN
75.0000 mL | Freq: Once | INTRAMUSCULAR | Status: AC | PRN
  Administered 2021-10-10: 75 mL via INTRAVENOUS
  Filled 2021-10-10: qty 75

## 2021-10-10 MED ORDER — IBUPROFEN 600 MG PO TABS
600.0000 mg | ORAL_TABLET | Freq: Once | ORAL | Status: AC
  Administered 2021-10-10: 600 mg via ORAL
  Filled 2021-10-10: qty 1

## 2021-10-10 NOTE — ED Triage Notes (Signed)
Pt states she has been dealing with a severe sore throat times 1 month and started her 3rd antibiotic on Monday. Fever for 5 days. Pt reports increased pain. Negative strep, flu, covid and mono test on Monday. Patent airway and had a throat swab cultured that she has not heard back from. Tylenol taken at 1500. ?

## 2021-10-10 NOTE — ED Provider Notes (Signed)
? ?[facility] ?Provider Note ? ? ? Event Date/Time  ? First MD Initiated Contact with Patient 10/10/21 2103   ?  (approximate) ? ? ?History  ? ?Sore Throat and Dysphagia ? ? ?HPI ? ?Veronica Pena is a 36 y.o. female  otherwise healthy, vaccinated growing up coming in with sore throat.  Patient reports symptoms for 5 days.  She has had previous issues as well see as below. ? ? ? ?4/10 Pt started on cefdinir -mono and strep is all negative I reviewed patient's note from her doctor this day. ?In march saw ENT and got 14 days of augmentin and prednisone ?In march also saw urgent care and place on zpac and prednisone.  ? ?Symptoms  went away after getting 14 days of augmentin and then it came back April 2nd.  She came in here due to having a fever and worsening pain and difficulty swallowing now as well. Has had the fever for five days. Last took tylenol 3pm. Last took ibuprofen 11AM.  With mom out of the room she denies any recent sexual activity or history of gonorrhea chlamydia or HIV that she knows of. ? ? ? ? ? ? ?Physical Exam  ? ?Triage Vital Signs: ?ED Triage Vitals [10/10/21 1948]  ?Enc Vitals Group  ?   BP 108/76  ?   Pulse Rate (!) 115  ?   Resp 18  ?   Temp (!) 100.7 ?F (38.2 ?C)  ?   Temp Source Oral  ?   SpO2 100 %  ?   Weight 115 lb (52.2 kg)  ?   Height 5\' 3"  (1.6 m)  ?   Head Circumference   ?   Peak Flow   ?   Pain Score 10  ?   Pain Loc   ?   Pain Edu?   ?   Excl. in GC?   ? ? ?Most recent vital signs: ?Vitals:  ? 10/10/21 1948  ?BP: 108/76  ?Pulse: (!) 115  ?Resp: 18  ?Temp: (!) 100.7 ?F (38.2 ?C)  ?SpO2: 100%  ? ? ? ?General: Awake, no distress.  ?CV:  Good peripheral perfusion.  ?Resp:  Normal effort.  ?Abd:  No distention.  ?Other:  Patient has exudates noted on bilateral tonsils.  No obvious swelling uvula looks midline.  Range of motion of neck intact ? ? ?ED Results / Procedures / Treatments  ? ?Labs ?(all labs ordered are listed, but only abnormal results are  displayed) ?Labs Reviewed  ?GROUP A STREP BY PCR  ?RESP PANEL BY RT-PCR (FLU A&B, COVID) ARPGX2  ?CHLAMYDIA/NGC RT PCR ([facility] ONLY)            ?CBC WITH DIFFERENTIAL/PLATELET  ?BASIC METABOLIC PANEL  ?HIV ANTIBODY (ROUTINE TESTING W REFLEX)  ? ? ? ?RADIOLOGY ?I have reviewed the CT personally waiting on final read  ? ? ?PROCEDURES: ? ?Critical Care performed: No ? ?Procedures ? ? ?MEDICATIONS ORDERED IN ED: ?Medications  ?ibuprofen (ADVIL) tablet 600 mg (600 mg Oral Given 10/10/21 1956)  ?sodium chloride 0.9 % bolus 1,000 mL (0 mLs Intravenous Stopped 10/10/21 2330)  ?dexamethasone (DECADRON) 1 MG/ML solution 10 mg (10 mg Oral Given 10/10/21 2234)  ?ondansetron [facility]) injection 4 mg (4 mg Intravenous Given 10/10/21 2242)  ?dexamethasone (DECADRON) 1 MG/ML solution 10 mg (10 mg Oral Given 10/10/21 2330)  ?iohexol (OMNIPAQUE) 300 MG/ML solution 75 mL (75 mLs Intravenous Contrast Given 10/10/21 2322)  ? ? ? ?IMPRESSION / MDM /  ASSESSMENT AND PLAN / ED COURSE  ?I reviewed the triage vital signs and the nursing notes. ?             ?               ? ? ?Patient comes in with recurrent strep throats.  Difficulty swallowing.  We will test for gonorrhea, chlamydia, HIV although seems less likely.  Will get CT to make sure no evidence of retropharyngeal abscess or peritonsillar abscess given continued symptoms.  We will give some IV fluid ? ?Patient is labs show slightly low potassium we will give some oral repletion.  White count is downtrending down from 9 for when she was seen recently.  COVID, flu are negative, strep is negative. ? ?Patient handed off pending CT imaging and reevaluation.   ? ?FINAL CLINICAL IMPRESSION(S) / ED DIAGNOSES  ? ?Final diagnoses:  ?Tonsillitis  ? ? ? ?Rx / DC Orders  ? ?ED Discharge Orders   ? ? None  ? ?  ? ? ? ?Note:  This document was prepared using Dragon voice recognition software and may include unintentional dictation errors. ?  ?Concha Se, MD ?10/12/21 0008 ? ?

## 2021-10-11 LAB — CHLAMYDIA/NGC RT PCR (ARMC ONLY)
Chlamydia Tr: NOT DETECTED
N gonorrhoeae: NOT DETECTED

## 2021-10-11 LAB — HIV ANTIBODY (ROUTINE TESTING W REFLEX): HIV Screen 4th Generation wRfx: NONREACTIVE

## 2021-10-11 MED ORDER — LACTATED RINGERS IV BOLUS
1000.0000 mL | Freq: Once | INTRAVENOUS | Status: AC
  Administered 2021-10-11: 1000 mL via INTRAVENOUS

## 2021-10-11 MED ORDER — POTASSIUM CHLORIDE CRYS ER 20 MEQ PO TBCR: 40.0000 meq | EXTENDED_RELEASE_TABLET | Freq: Once | ORAL | Status: DC

## 2021-10-11 MED ORDER — POTASSIUM CHLORIDE 20 MEQ PO PACK
40.0000 meq | PACK | Freq: Once | ORAL | Status: AC
  Administered 2021-10-11: 40 meq via ORAL
  Filled 2021-10-11: qty 2

## 2021-10-11 NOTE — Discharge Instructions (Addendum)
Please continue on your prescribed antibiotics ?

## 2022-06-16 IMAGING — CT CT NECK W/ CM
4 of 5 series · 14 of 33 positions shown, 16 images · IV contrast (agent unspecified)
Comparison: None.

CLINICAL DATA: Initial evaluation for acute soft tissue swelling,
infection suspected, severe sore throat.

EXAM:
CT NECK WITH CONTRAST
TECHNIQUE: Multidetector CT imaging of the neck was performed using the
standard protocol following the bolus administration of intravenous
contrast.

[Series 4: axial bone · axial · 0.52mm/px · z∈[+338,+442]mm · 3 of 105 slices shown, 4 images]
[im 27/105  soft-tissue]
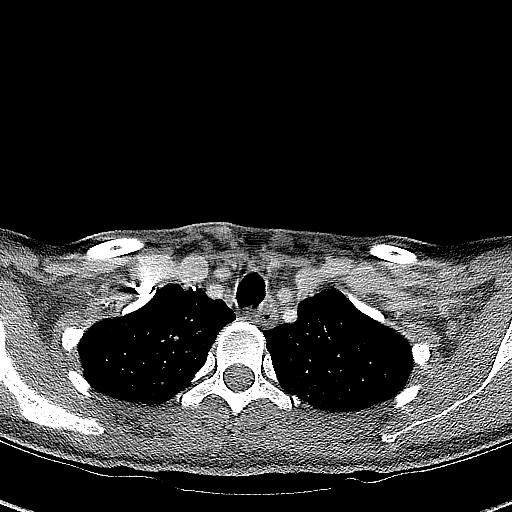
[im 27/105  bone]
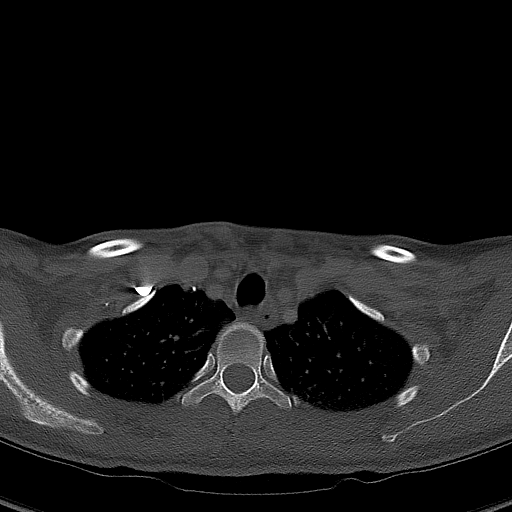
[im 53/105  bone]
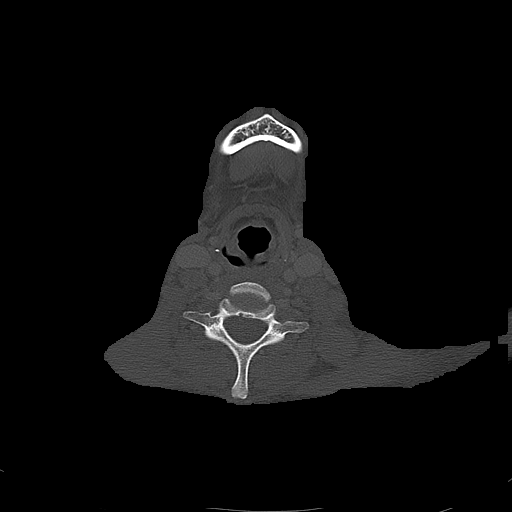
[im 79/105  bone]
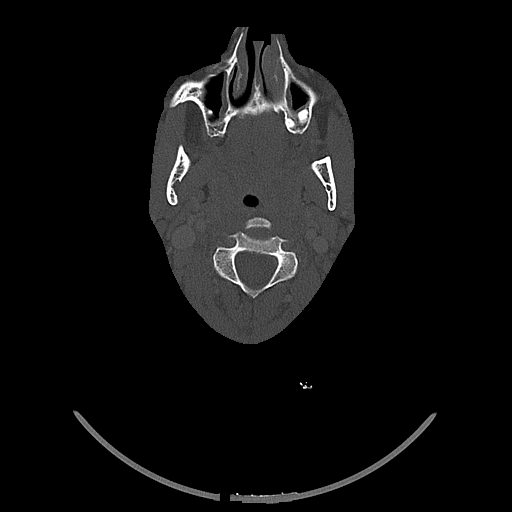

[Series 5: sag neck · sagittal · 0.38mm/px · 5 of 65 slices shown, 6 images]
[im 22/65  bone]
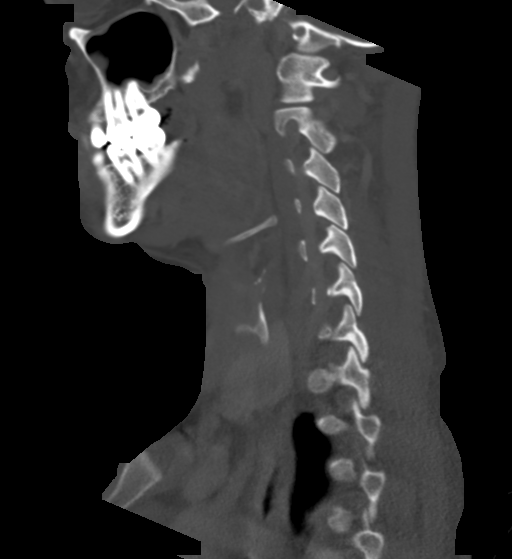
[im 27/65  bone]
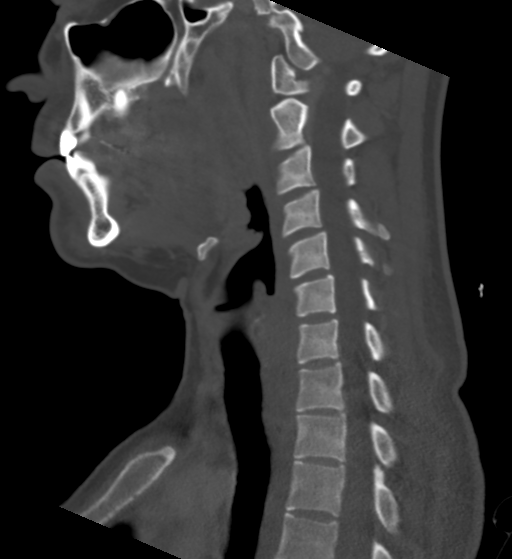
[im 33/65  soft-tissue]
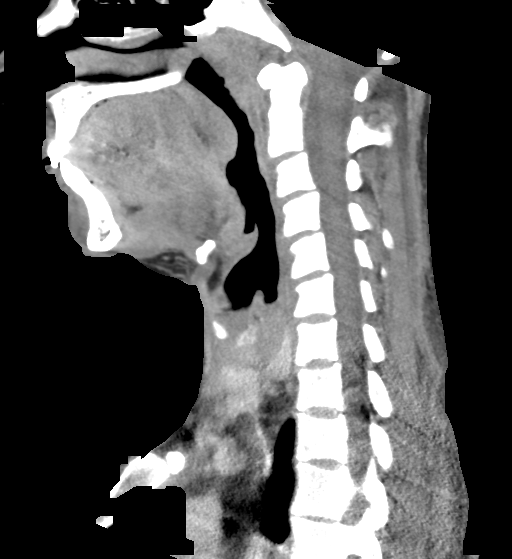
[im 33/65  bone]
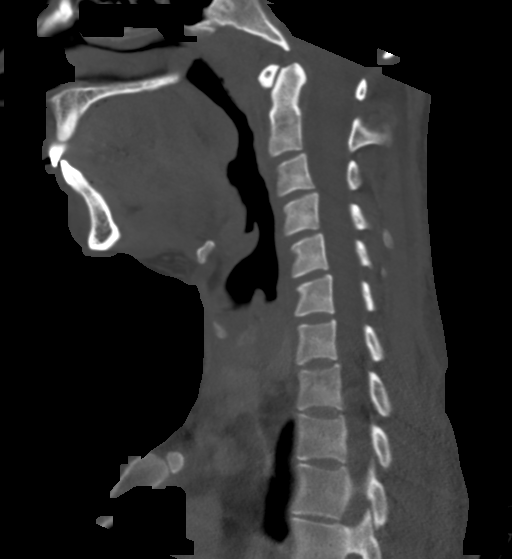
[im 38/65  bone]
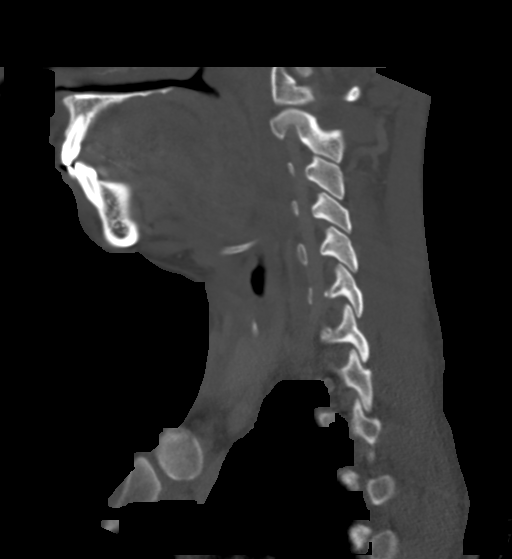
[im 43/65  bone]
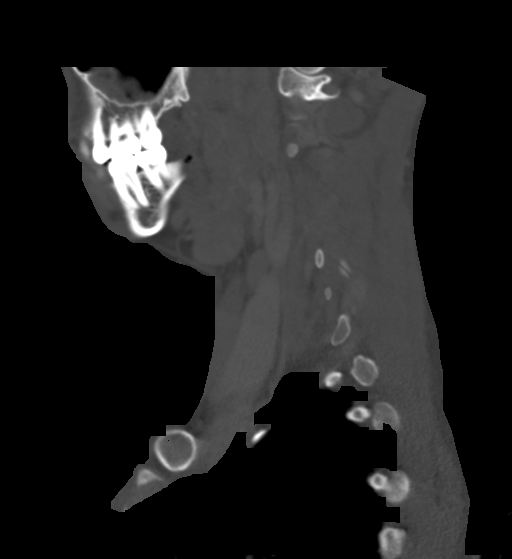

[Series 6: cor neck · coronal · 0.30mm/px · 3 of 89 slices shown]
[im 27/89  bone]
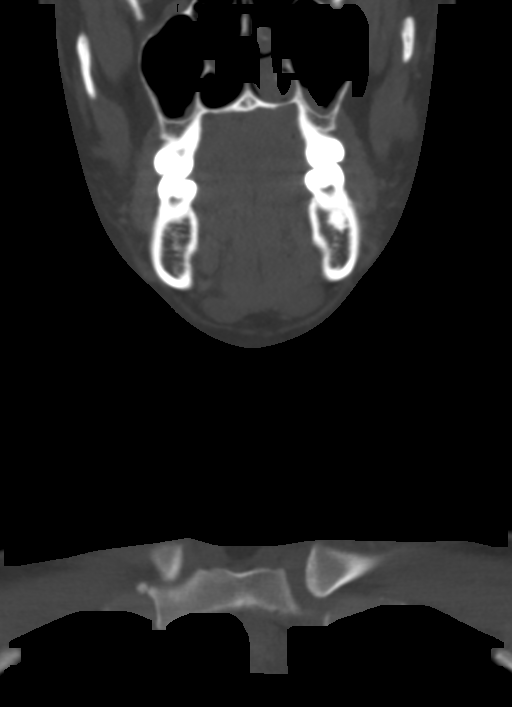
[im 39/89  bone]
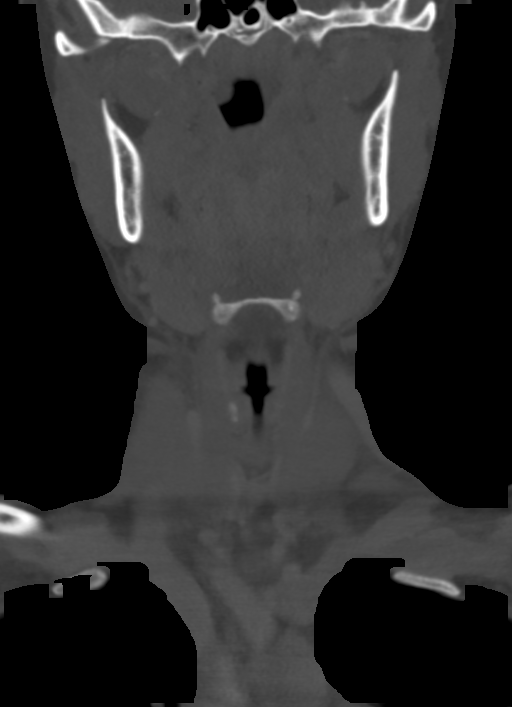
[im 51/89  bone]
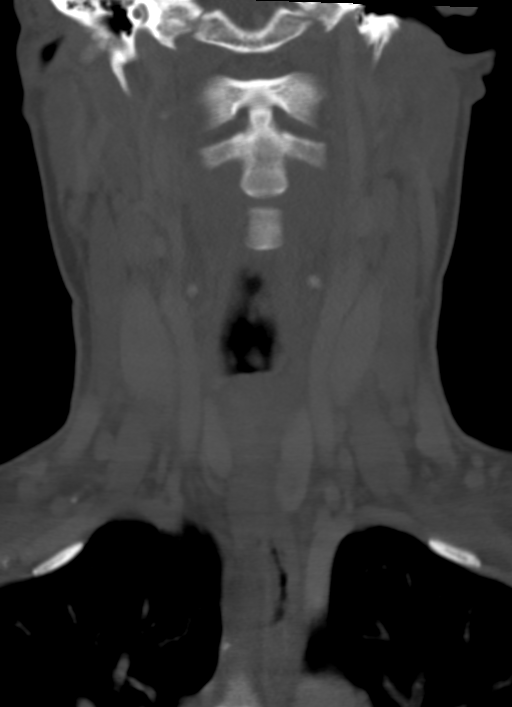

[Series 7: orthogonal (person_name) · axial · 0.32mm/px · z∈[+296,+395]mm · 3 of 108 slices shown]
[im 27/108  bone]
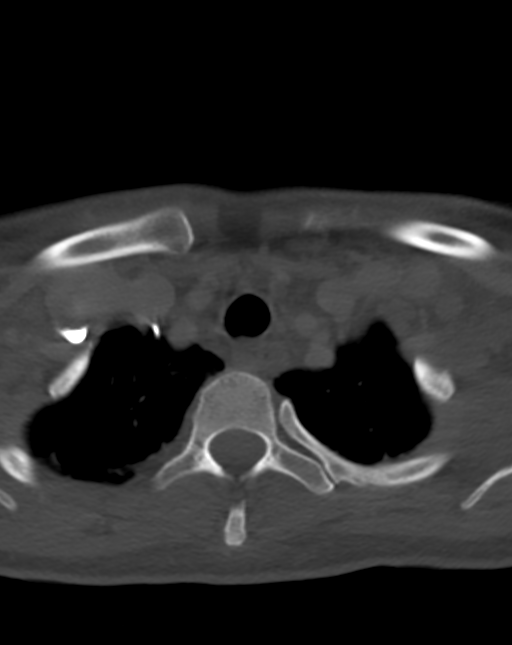
[im 54/108  bone]
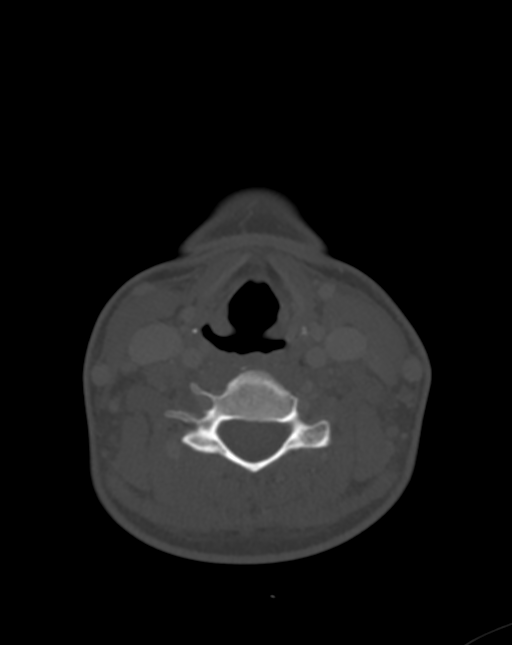
[im 81/108  bone]
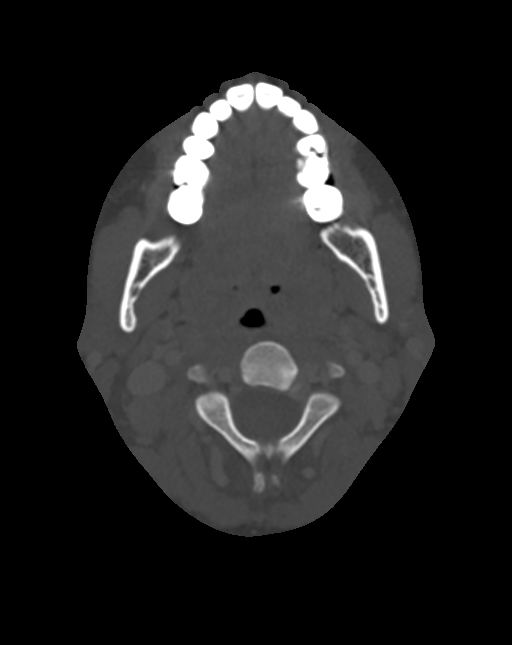

[14 of 33 positions shown; findings below may reference images not displayed]

RADIATION DOSE REDUCTION: This exam was performed according to the
departmental dose-optimization program which includes automated
exposure control, adjustment of the mA and/or kV according to
patient size and/or use of iterative reconstruction technique.

CONTRAST:  75mL OMNIPAQUE IOHEXOL 300 MG/ML  SOLN
FINDINGS: Pharynx and larynx: Oral cavity within normal limits. No acute
inflammatory changes seen about the dentition. Palatine tonsils are
heterogeneous and hypertrophied, suggesting acute tonsillitis. No
discrete tonsillar or peritonsillar abscess. Adenoidal soft tissues
prominent and hypertrophied as well. No significant retropharyngeal
swelling. Epiglottis itself within normal limits. Lingual tonsils
hypertrophied and largely filled the vallecula. Remainder of the
hypopharynx and supraglottic larynx within normal limits. Glottis
normal. Subglottic airway widely patent clear.

Salivary glands: Salivary glands including the parotid and
submandibular glands are normal.

Thyroid: 5 mm left thyroid nodule , of doubtful significance given
size and patient age, no follow-up imaging recommended. (Ref: [HOSPITAL]. [DATE]): 143-50).

Lymph nodes: Multiple prominent upper cervical lymph nodes, largest
of which measures 1.5 cm on the right at level 2. Findings are
likely reactive.

Vascular: Normal intravascular enhancement seen throughout the neck.

Limited intracranial: Unremarkable.

Visualized orbits: Unremarkable.

Mastoids and visualized paranasal sinuses: Scattered mucosal
thickening noted within the visualized ethmoidal air cells and
maxillary sinuses. Visualized paranasal sinuses are otherwise clear.
Visualized mastoids and middle ear cavities are clear.

Skeleton: No discrete or worrisome osseous lesions.

Upper chest: Visualized upper chest demonstrates no acute finding.

Other: None.
IMPRESSION: 1. Findings consistent with acute tonsillitis. No discrete tonsillar
or peritonsillar abscess.
2. Enlarged upper cervical lymph nodes, likely reactive.

## 2022-11-08 ENCOUNTER — Other Ambulatory Visit: Payer: Self-pay | Admitting: Unknown Physician Specialty

## 2022-11-08 ENCOUNTER — Ambulatory Visit
Admission: RE | Admit: 2022-11-08 | Discharge: 2022-11-08 | Disposition: A | Discharge status: Home / Self Care | Payer: BC Managed Care – PPO | Source: Ambulatory Visit | Attending: Unknown Physician Specialty | Admitting: Unknown Physician Specialty

## 2022-11-08 DIAGNOSIS — R059 Cough, unspecified: Secondary | ICD-10-CM

## 2024-04-29 ENCOUNTER — Encounter: Payer: Self-pay | Admitting: Emergency Medicine

## 2024-04-29 ENCOUNTER — Emergency Department
Admission: EM | Admit: 2024-04-29 | Discharge: 2024-04-29 | Disposition: A | Discharge status: Home / Self Care | Attending: Emergency Medicine | Admitting: Emergency Medicine

## 2024-04-29 ENCOUNTER — Other Ambulatory Visit: Payer: Self-pay

## 2024-04-29 DIAGNOSIS — R220 Localized swelling, mass and lump, head: Secondary | ICD-10-CM | POA: Diagnosis present

## 2024-04-29 DIAGNOSIS — T782XXA Anaphylactic shock, unspecified, initial encounter: Secondary | ICD-10-CM | POA: Diagnosis not present

## 2024-04-29 MED ORDER — FAMOTIDINE 20 MG PO TABS
40.0000 mg | ORAL_TABLET | Freq: Once | ORAL | Status: AC
  Administered 2024-04-29: 40 mg via ORAL
  Filled 2024-04-29: qty 2

## 2024-04-29 MED ORDER — PREDNISONE 20 MG PO TABS: 40.0000 mg | ORAL_TABLET | Freq: Every day | ORAL | 0 refills | Status: AC

## 2024-04-29 NOTE — ED Triage Notes (Signed)
 Presents via EMS from doctors office  States she went to get her allergy shots  but did not take her antihistamine prior Developed hives and some swelling to throat  Solumedrol 125 mg Benadryl 50 mg po 0.3 epi  Above meds given at doctors office

## 2024-04-29 NOTE — ED Provider Notes (Signed)
 [facility] Provider Note    Event Date/Time   First MD Initiated Contact with Patient 04/29/24 1721     (approximate)   History   Chief Complaint: Allergic Reaction (/)   HPI  Veronica Pena is a 38 y.o. female with no significant past medical history who sent to the ED from her doctor's office due to facial swelling after receiving an allergy desensitization injection.  She normally takes an antihistamine before the injections, but forgot at this time.  After the swelling started she received Solu-Medrol, oral Benadryl, EpiPen at the doctor's office.  She reports symptoms are gradually improving.  Denies trouble breathing or swallowing, but throat feels scratchy.        Past Medical History:  Diagnosis Date  . Acid reflux   . Breast discharge Dec 2016   1 time, milky discharge, spontaneous  . 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 Tremor    bilateral hands    Current Outpatient Rx  . Order #: [id number] Class: Normal    History reviewed. No pertinent surgical history.  Physical Exam   Triage Vital Signs: ED Triage Vitals  Encounter Vitals Group     BP 04/29/24 1733 126/82     Girls Systolic BP Percentile --      Girls Diastolic BP Percentile --      Boys Systolic BP Percentile --      Boys Diastolic BP Percentile --      Pulse Rate 04/29/24 1733 100     Resp 04/29/24 1733 20     Temp 04/29/24 1733 98.7 F (37.1 C)     Temp Source 04/29/24 1733 Oral     SpO2 04/29/24 1733 100 %     Weight 04/29/24 1723 116 lb 13.5 oz (53 kg)     Height 04/29/24 1723 5' 3 (1.6 m)     Head Circumference --      Peak Flow --      Pain Score 04/29/24 1728 0     Pain Loc --      Pain Education --      Exclude from Growth Chart --     Most recent vital signs: Vitals:   04/29/24 1733 04/29/24 1845  BP: 126/82   Pulse: 100   Resp: 20   Temp: 98.7 F (37.1 C)   SpO2: 100% 100%    General: Awake, no  distress.  CV:  Good peripheral perfusion.  Regular rate rhythm Resp:  Normal effort.  Clear lungs without wheezing or stridor Abd:  No distention.  Other:  Generalized facial edema.  No tongue swelling or elevation.  Normal uvula and tonsils.   ED Results / Procedures / Treatments   Labs (all labs ordered are listed, but only abnormal results are displayed) Labs Reviewed - No data to display   EKG    RADIOLOGY    PROCEDURES:  Procedures   MEDICATIONS ORDERED IN ED: Medications  famotidine (PEPCID) tablet 40 mg (40 mg Oral Given 04/29/24 1846)     IMPRESSION / MDM / ASSESSMENT AND PLAN / ED COURSE  I reviewed the triage vital signs and the nursing notes.   Patient's presentation is most consistent with acute presentation with potential threat to life or bodily function.  Patient presents with anaphylactic reaction, already received epi, Solu-Medrol, Benadryl.  Vital signs are normal, not in distress, no signs of shock or respiratory compromise.  Will observe in  ED, add Pepcid.   Clinical Course as of 04/29/24 1923  Thu Apr 29, 2024  1922 Facial edema improved. Mouth/OP remains normal. Ctab. Stable for DC. [PS]    Clinical Course User Index [PS] Viviann Pastor, MD     FINAL CLINICAL IMPRESSION(S) / ED DIAGNOSES   Final diagnoses:  Anaphylaxis, initial encounter     Rx / DC Orders   ED Discharge Orders     None        Note:  This document was prepared using Dragon voice recognition software and may include unintentional dictation errors.   Viviann Pastor, MD 04/29/24 [phone]
